# Patient Record
Sex: Male | Born: 1960 | Race: White | Hispanic: No | Marital: Single | Smoking: Current some day smoker
Health system: Southern US, Community
[De-identification: ages and names within clinical notes are randomized; demographics above are authoritative.]

## PROBLEM LIST (undated history)

## (undated) DIAGNOSIS — E785 Hyperlipidemia, unspecified: Secondary | ICD-10-CM

## (undated) DIAGNOSIS — I10 Essential (primary) hypertension: Secondary | ICD-10-CM

## (undated) DIAGNOSIS — M5136 Other intervertebral disc degeneration, lumbar region: Secondary | ICD-10-CM

## (undated) DIAGNOSIS — Z973 Presence of spectacles and contact lenses: Secondary | ICD-10-CM

## (undated) DIAGNOSIS — G43909 Migraine, unspecified, not intractable, without status migrainosus: Secondary | ICD-10-CM

## (undated) DIAGNOSIS — K219 Gastro-esophageal reflux disease without esophagitis: Secondary | ICD-10-CM

## (undated) DIAGNOSIS — M1990 Unspecified osteoarthritis, unspecified site: Secondary | ICD-10-CM

## (undated) DIAGNOSIS — M51369 Other intervertebral disc degeneration, lumbar region without mention of lumbar back pain or lower extremity pain: Secondary | ICD-10-CM

## (undated) DIAGNOSIS — Z8489 Family history of other specified conditions: Secondary | ICD-10-CM

## (undated) DIAGNOSIS — R739 Hyperglycemia, unspecified: Secondary | ICD-10-CM

## (undated) DIAGNOSIS — E559 Vitamin D deficiency, unspecified: Secondary | ICD-10-CM

## (undated) DIAGNOSIS — K635 Polyp of colon: Secondary | ICD-10-CM

## (undated) HISTORY — PX: BACK SURGERY: SHX140

---

## 2004-03-28 ENCOUNTER — Ambulatory Visit: Payer: Self-pay | Admitting: Gastroenterology

## 2009-09-26 ENCOUNTER — Ambulatory Visit: Payer: Self-pay | Admitting: Gastroenterology

## 2013-08-04 ENCOUNTER — Emergency Department: Payer: Self-pay | Admitting: Emergency Medicine

## 2015-08-03 ENCOUNTER — Encounter: Payer: Self-pay | Admitting: *Deleted

## 2015-08-04 ENCOUNTER — Encounter
Admission: RE | Disposition: A | Discharge status: Home / Self Care | Payer: Self-pay | Source: Ambulatory Visit | Attending: Gastroenterology

## 2015-08-04 ENCOUNTER — Encounter: Payer: Self-pay | Admitting: *Deleted

## 2015-08-04 ENCOUNTER — Ambulatory Visit: Payer: Managed Care, Other (non HMO) | Admitting: Anesthesiology

## 2015-08-04 ENCOUNTER — Ambulatory Visit
Admission: RE | Admit: 2015-08-04 | Discharge: 2015-08-04 | Disposition: A | Discharge status: Home / Self Care | Payer: Managed Care, Other (non HMO) | Source: Ambulatory Visit | Attending: Gastroenterology | Admitting: Gastroenterology

## 2015-08-04 DIAGNOSIS — M1990 Unspecified osteoarthritis, unspecified site: Secondary | ICD-10-CM | POA: Diagnosis not present

## 2015-08-04 DIAGNOSIS — G43909 Migraine, unspecified, not intractable, without status migrainosus: Secondary | ICD-10-CM | POA: Diagnosis not present

## 2015-08-04 DIAGNOSIS — K219 Gastro-esophageal reflux disease without esophagitis: Secondary | ICD-10-CM | POA: Insufficient documentation

## 2015-08-04 DIAGNOSIS — E785 Hyperlipidemia, unspecified: Secondary | ICD-10-CM | POA: Diagnosis not present

## 2015-08-04 DIAGNOSIS — Z79899 Other long term (current) drug therapy: Secondary | ICD-10-CM | POA: Diagnosis not present

## 2015-08-04 DIAGNOSIS — Z8371 Family history of colonic polyps: Secondary | ICD-10-CM | POA: Insufficient documentation

## 2015-08-04 DIAGNOSIS — Z1211 Encounter for screening for malignant neoplasm of colon: Secondary | ICD-10-CM | POA: Insufficient documentation

## 2015-08-04 DIAGNOSIS — D124 Benign neoplasm of descending colon: Secondary | ICD-10-CM | POA: Insufficient documentation

## 2015-08-04 DIAGNOSIS — I10 Essential (primary) hypertension: Secondary | ICD-10-CM | POA: Diagnosis not present

## 2015-08-04 DIAGNOSIS — E559 Vitamin D deficiency, unspecified: Secondary | ICD-10-CM | POA: Insufficient documentation

## 2015-08-04 DIAGNOSIS — M5136 Other intervertebral disc degeneration, lumbar region: Secondary | ICD-10-CM | POA: Insufficient documentation

## 2015-08-04 DIAGNOSIS — N529 Male erectile dysfunction, unspecified: Secondary | ICD-10-CM | POA: Diagnosis not present

## 2015-08-04 HISTORY — PX: COLONOSCOPY WITH PROPOFOL: SHX5780

## 2015-08-04 HISTORY — DX: Unspecified osteoarthritis, unspecified site: M19.90

## 2015-08-04 HISTORY — DX: Essential (primary) hypertension: I10

## 2015-08-04 HISTORY — DX: Vitamin D deficiency, unspecified: E55.9

## 2015-08-04 HISTORY — DX: Polyp of colon: K63.5

## 2015-08-04 HISTORY — DX: Other intervertebral disc degeneration, lumbar region without mention of lumbar back pain or lower extremity pain: M51.369

## 2015-08-04 HISTORY — DX: Hyperlipidemia, unspecified: E78.5

## 2015-08-04 HISTORY — DX: Gastro-esophageal reflux disease without esophagitis: K21.9

## 2015-08-04 HISTORY — DX: Hyperglycemia, unspecified: R73.9

## 2015-08-04 HISTORY — DX: Other intervertebral disc degeneration, lumbar region: M51.36

## 2015-08-04 HISTORY — DX: Migraine, unspecified, not intractable, without status migrainosus: G43.909

## 2015-08-04 SURGERY — COLONOSCOPY WITH PROPOFOL
Anesthesia: General

## 2015-08-04 MED ORDER — PROPOFOL 500 MG/50ML IV EMUL
INTRAVENOUS | Status: DC | PRN
  Administered 2015-08-04: 125 ug/kg/min via INTRAVENOUS

## 2015-08-04 MED ORDER — PROPOFOL 10 MG/ML IV BOLUS
INTRAVENOUS | Status: DC | PRN
  Administered 2015-08-04: 100 mg via INTRAVENOUS

## 2015-08-04 MED ORDER — LACTATED RINGERS IV SOLN
INTRAVENOUS | Status: DC | PRN
  Administered 2015-08-04: 11:00:00 via INTRAVENOUS

## 2015-08-04 MED ORDER — SODIUM CHLORIDE 0.9 % IV SOLN
INTRAVENOUS | Status: DC
  Administered 2015-08-04: 10:00:00 via INTRAVENOUS

## 2015-08-04 MED ORDER — SODIUM CHLORIDE 0.9 % IV SOLN: INTRAVENOUS | Status: DC

## 2015-08-04 NOTE — Transfer of Care (Signed)
Immediate Anesthesia Transfer of Care Note  Patient: Marco Richards  Procedure(s) Performed: Procedure(s): COLONOSCOPY WITH PROPOFOL (N/A)  Patient Location: PACU and Endoscopy Unit  Anesthesia Type:General  Level of Consciousness: awake, alert  and oriented  Airway & Oxygen Therapy: Patient Spontanous Breathing  Post-op Assessment: Report given to RN and Post -op Vital signs reviewed and stable  Post vital signs: Reviewed and stable  Last Vitals:  Filed Vitals:   08/04/15 1002  BP: 174/103  Pulse: 107  Temp: 36.7 C  Resp: 18    Complications: No apparent anesthesia complications

## 2015-08-04 NOTE — H&P (Signed)
    Primary Care Physician:  Adin Hector, MD Primary Gastroenterologist:  Dr. Candace Cruise  Pre-Procedure History & Physical: HPI:  Marco Richards is a 55 y.o. male is here for an colonoscopy.   Past Medical History  Diagnosis Date  . Arthritis   . Degenerative disc disease, lumbar   . GERD (gastroesophageal reflux disease)   . Colon polyps   . Hyperglycemia   . Hyperlipidemia   . Hypertension   . Migraine   . Vitamin D deficiency     Past Surgical History  Procedure Laterality Date  . Colonoscopy    . Back surgery      Prior to Admission medications   Medication Sig Start Date End Date Taking? Authorizing Provider  amLODipine (NORVASC) 5 MG tablet Take 5 mg by mouth daily.   Yes Historical Provider, MD  cholecalciferol (VITAMIN D) 400 units TABS tablet Take 1,000 Units by mouth.   Yes Historical Provider, MD  hydrochlorothiazide (MICROZIDE) 12.5 MG capsule Take 12.5 mg by mouth daily.   Yes Historical Provider, MD  omeprazole (PRILOSEC) 40 MG capsule Take 40 mg by mouth daily.   Yes Historical Provider, MD  pravastatin (PRAVACHOL) 80 MG tablet Take 80 mg by mouth daily.   Yes Historical Provider, MD  tadalafil (CIALIS) 20 MG tablet Take 20 mg by mouth daily as needed for erectile dysfunction.   Yes Historical Provider, MD    Allergies as of 07/26/2015  . (Not on File)    History reviewed. No pertinent family history.  Social History   Social History  . Marital Status: Married    Spouse Name: N/A  . Number of Children: N/A  . Years of Education: N/A   Occupational History  . Not on file.   Social History Main Topics  . Smoking status: Never Smoker   . Smokeless tobacco: Not on file  . Alcohol Use: No  . Drug Use: No  . Sexual Activity: Not on file   Other Topics Concern  . Not on file   Social History Narrative    Review of Systems: See HPI, otherwise negative ROS  Physical Exam: BP 174/103 mmHg  Pulse 107  Temp(Src) 98 F (36.7 C) (Tympanic)   Resp 18  Ht 5\' 9"  (1.753 m)  Wt 92.987 kg (205 lb)  BMI 30.26 kg/m2  SpO2 98% General:   Alert,  pleasant and cooperative in NAD Head:  Normocephalic and atraumatic. Neck:  Supple; no masses or thyromegaly. Lungs:  Clear throughout to auscultation.    Heart:  Regular rate and rhythm. Abdomen:  Soft, nontender and nondistended. Normal bowel sounds, without guarding, and without rebound.   Neurologic:  Alert and  oriented x4;  grossly normal neurologically.  Impression/Plan: Marco Richards is here for an colonoscopy to be performed for screening, family hx of colon polyps  Risks, benefits, limitations, and alternatives regarding colonoscopy have been reviewed with the patient.  Questions have been answered.  All parties agreeable.   Anarie Kalish, Lupita Dawn, MD  08/04/2015, 10:33 AM

## 2015-08-04 NOTE — Op Note (Signed)
[facility] Gastroenterology Patient Name: Marco Richards Procedure Date: 08/04/2015 11:17 AM MRN: [id number] Account #: [id number] Date of Birth: May 20, 1961 Admit Type: Outpatient Age: 55 Room: [facility] ENDO ROOM 4 Gender: Male Note Status: Finalized Procedure:         Colonoscopy Indications:       Colon cancer screening in patient at increased risk:                     Family history of 1st-degree relative with colon polyps Providers:         Lupita Dawn. Candace Cruise, MD Referring MD:      Ramonita Lab, MD (Referring MD) Medicines:         Monitored Anesthesia Care Complications:     No immediate complications. Procedure:         Pre-Anesthesia Assessment:                    - Prior to the procedure, a History and Physical was                     performed, and patient medications, allergies and                     sensitivities were reviewed. The patient's tolerance of                     previous anesthesia was reviewed.                    - The risks and benefits of the procedure and the sedation                     options and risks were discussed with the patient. All                     questions were answered and informed consent was obtained.                    - After reviewing the risks and benefits, the patient was                     deemed in satisfactory condition to undergo the procedure.                    After obtaining informed consent, the colonoscope was                     passed under direct vision. Throughout the procedure, the                     patient's blood pressure, pulse, and oxygen saturations                     were monitored continuously. The Colonoscope was                     introduced through the anus and advanced to the the cecum,                     identified by appendiceal orifice and ileocecal valve. The                     colonoscopy was performed without difficulty. The patient  tolerated the procedure well. The  quality of the bowel                     preparation was good. Findings:      A small polyp was found in the descending colon. The polyp was sessile.       The polyp was removed with a cold snare. Resection and retrieval were       complete.      The exam was otherwise without abnormality. Impression:        - One small polyp in the descending colon. Resected and                     retrieved.                    - The examination was otherwise normal. Recommendation:    - Discharge patient to home.                    - Await pathology results.                    - Repeat colonoscopy in 5 years for surveillance based on                     pathology results.                    - The findings and recommendations were discussed with the                     patient. Procedure Code(s): --- Professional ---                    [phone], Colonoscopy, flexible; with removal of tumor(s),                     polyp(s), or other lesion(s) by snare technique Diagnosis Code(s): --- Professional ---                    Z83.71, Family history of colonic polyps                    D12.4, Benign neoplasm of descending colon CPT copyright 2014 [employer]. All rights reserved. The codes documented in this report are preliminary and upon coder review may  be revised to meet current compliance requirements. Hulen Luster, MD 08/04/2015 11:36:31 AM This report has been signed electronically. Number of Addenda: 0 Note Initiated On: 08/04/2015 11:17 AM Scope Withdrawal Time: 0 hours 8 minutes 41 seconds  Total Procedure Duration: 0 hours 11 minutes 57 seconds       [facility]

## 2015-08-04 NOTE — Anesthesia Postprocedure Evaluation (Signed)
Anesthesia Post Note  Patient: Marco Richards  Procedure(s) Performed: Procedure(s) (LRB): COLONOSCOPY WITH PROPOFOL (N/A)  Patient location during evaluation: Endoscopy Anesthesia Type: General Level of consciousness: awake and alert Pain management: pain level controlled Vital Signs Assessment: post-procedure vital signs reviewed and stable Respiratory status: spontaneous breathing, nonlabored ventilation, respiratory function stable and patient connected to nasal cannula oxygen Cardiovascular status: blood pressure returned to baseline and stable Postop Assessment: no signs of nausea or vomiting Anesthetic complications: no    Last Vitals:  Filed Vitals:   08/04/15 1200 08/04/15 1210  BP: 161/95 165/94  Pulse: 86 80  Temp:    Resp: 15 16    Last Pain:  Filed Vitals:   08/04/15 1217  PainSc: 0-No pain                 Precious Haws Briane Birden

## 2015-08-04 NOTE — Anesthesia Preprocedure Evaluation (Addendum)
Anesthesia Evaluation  Patient identified by MRN, date of birth, ID band Patient awake    Reviewed: Allergy & Precautions, H&P , NPO status , Patient's Chart, lab work & pertinent test results  History of Anesthesia Complications Negative for: history of anesthetic complications  Airway Mallampati: III  TM Distance: >3 FB Neck ROM: full    Dental  (+) Poor Dentition, Chipped   Pulmonary neg pulmonary ROS, neg shortness of breath,    Pulmonary exam normal breath sounds clear to auscultation       Cardiovascular Exercise Tolerance: Good hypertension, (-) angina(-) DOE Normal cardiovascular exam Rhythm:regular Rate:Normal     Neuro/Psych  Headaches, negative psych ROS   GI/Hepatic Neg liver ROS, GERD  Controlled,  Endo/Other  negative endocrine ROS  Renal/GU negative Renal ROS  negative genitourinary   Musculoskeletal  (+) Arthritis ,   Abdominal   Peds  Hematology negative hematology ROS (+)   Anesthesia Other Findings Past Medical History:   Arthritis                                                    Degenerative disc disease, lumbar                            GERD (gastroesophageal reflux disease)                       Colon polyps                                                 Hyperglycemia                                                Hyperlipidemia                                               Hypertension                                                 Migraine                                                     Vitamin D deficiency                                        Past Surgical History:   COLONOSCOPY  BACK SURGERY                                                 BMI    Body Mass Index   30.25 kg/m 2      Reproductive/Obstetrics negative OB ROS                             Anesthesia Physical Anesthesia Plan  ASA:  III  Anesthesia Plan: General   Post-op Pain Management:    Induction:   Airway Management Planned:   Additional Equipment:   Intra-op Plan:   Post-operative Plan:   Informed Consent: I have reviewed the patients History and Physical, chart, labs and discussed the procedure including the risks, benefits and alternatives for the proposed anesthesia with the patient or authorized representative who has indicated his/her understanding and acceptance.   Dental Advisory Given  Plan Discussed with: Anesthesiologist, CRNA and Surgeon  Anesthesia Plan Comments:         Anesthesia Quick Evaluation

## 2015-08-05 LAB — SURGICAL PATHOLOGY

## 2016-04-13 ENCOUNTER — Other Ambulatory Visit: Payer: Self-pay | Admitting: Student

## 2016-04-13 DIAGNOSIS — M25561 Pain in right knee: Secondary | ICD-10-CM

## 2016-04-27 ENCOUNTER — Emergency Department: Payer: Managed Care, Other (non HMO)

## 2016-04-27 ENCOUNTER — Emergency Department
Admission: EM | Admit: 2016-04-27 | Discharge: 2016-04-27 | Disposition: A | Discharge status: Home / Self Care | Payer: Managed Care, Other (non HMO) | Attending: Emergency Medicine | Admitting: Emergency Medicine

## 2016-04-27 ENCOUNTER — Encounter: Payer: Self-pay | Admitting: Emergency Medicine

## 2016-04-27 DIAGNOSIS — R918 Other nonspecific abnormal finding of lung field: Secondary | ICD-10-CM | POA: Diagnosis not present

## 2016-04-27 DIAGNOSIS — I10 Essential (primary) hypertension: Secondary | ICD-10-CM

## 2016-04-27 DIAGNOSIS — R531 Weakness: Secondary | ICD-10-CM | POA: Insufficient documentation

## 2016-04-27 DIAGNOSIS — R109 Unspecified abdominal pain: Secondary | ICD-10-CM | POA: Diagnosis not present

## 2016-04-27 DIAGNOSIS — R55 Syncope and collapse: Secondary | ICD-10-CM

## 2016-04-27 LAB — COMPREHENSIVE METABOLIC PANEL
ALBUMIN: 4.2 g/dL (ref 3.5–5.0)
ALT: 26 U/L (ref 17–63)
AST: 23 U/L (ref 15–41)
Alkaline Phosphatase: 81 U/L (ref 38–126)
Anion gap: 9 (ref 5–15)
BUN: 13 mg/dL (ref 6–20)
CHLORIDE: 100 mmol/L — AB (ref 101–111)
CO2: 27 mmol/L (ref 22–32)
CREATININE: 0.96 mg/dL (ref 0.61–1.24)
Calcium: 9.1 mg/dL (ref 8.9–10.3)
GFR calc Af Amer: >60 mL/min (ref >60)
GLUCOSE: 99 mg/dL (ref 65–99)
Potassium: 3.5 mmol/L (ref 3.5–5.1)
SODIUM: 136 mmol/L (ref 135–145)
Total Bilirubin: 0.1 mg/dL — ABNORMAL LOW (ref 0.3–1.2)
Total Protein: 7.7 g/dL (ref 6.5–8.1)

## 2016-04-27 LAB — CBC WITH DIFFERENTIAL/PLATELET
BASOS ABS: 0.1 10*3/uL (ref 0–0.1)
Basophils Relative: 1 %
Eosinophils Absolute: 0.1 10*3/uL (ref 0–0.7)
Eosinophils Relative: 2 %
HEMATOCRIT: 50.1 % (ref 40.0–52.0)
HEMOGLOBIN: 16.9 g/dL (ref 13.0–18.0)
LYMPHS PCT: 22 %
Lymphs Abs: 1.8 10*3/uL (ref 1.0–3.6)
MCH: 28.8 pg (ref 26.0–34.0)
MCHC: 33.7 g/dL (ref 32.0–36.0)
MCV: 85.5 fL (ref 80.0–100.0)
MONO ABS: 0.7 10*3/uL (ref 0.2–1.0)
Monocytes Relative: 8 %
NEUTROS ABS: 5.7 10*3/uL (ref 1.4–6.5)
Neutrophils Relative %: 67 %
Platelets: 370 10*3/uL (ref 150–440)
RBC: 5.86 MIL/uL (ref 4.40–5.90)
RDW: 12.9 % (ref 11.5–14.5)
WBC: 8.5 10*3/uL (ref 3.8–10.6)

## 2016-04-27 LAB — TROPONIN I
Troponin I: <0.03 ng/mL (ref <0.03)
Troponin I: <0.03 ng/mL (ref <0.03)

## 2016-04-27 MED ORDER — SODIUM CHLORIDE 0.9 % IV BOLUS (SEPSIS)
1000.0000 mL | Freq: Once | INTRAVENOUS | Status: AC
  Administered 2016-04-27: 1000 mL via INTRAVENOUS

## 2016-04-27 MED ORDER — IOPAMIDOL (ISOVUE-370) INJECTION 76%
125.0000 mL | Freq: Once | INTRAVENOUS | Status: AC | PRN
  Administered 2016-04-27: 125 mL via INTRAVENOUS

## 2016-04-27 MED ORDER — ASPIRIN 81 MG PO CHEW: 81.0000 mg | CHEWABLE_TABLET | Freq: Every day | ORAL | 0 refills | Status: AC

## 2016-04-27 NOTE — ED Provider Notes (Signed)
[facility] Emergency Department Provider Note  ____________________________________________   First MD Initiated Contact with Patient 04/27/16 1700     (approximate)  I have reviewed the triage vital signs and the nursing notes.   HISTORY  Chief Complaint Near Syncope   HPI Marco Richards is a 55 y.o. male with a history of colon polyps as well as hypertension presenting to the emergency department today with a near-syncopal episode. He says that he was driving in his car and had several episodes of lightheadedness and diaphoresis over the course of a half an hour. He says he had 2-3 episodes which lasted several minutes each where he said he felt like he was going to pass out. He denies any chest pain or abdominal pain. Denies any nausea or vomiting. Says that he has ongoing intermittent diarrhea over the past 2 years or so. Says that he also has had intermittent blood in the stool with blood on the outside of his stools. Pedis past febrile was just a polyp but no diverticular disease. Denies any burning with urination. Says that he has had multiple episodes of syncope over the past 15 years and had stress tests which were negative. He says that he also has a family history of aortic aneurysm in multiple relatives.   Past Medical History:  Diagnosis Date  . Arthritis   . Colon polyps   . Degenerative disc disease, lumbar   . GERD (gastroesophageal reflux disease)   . Hyperglycemia   . Hyperlipidemia   . Hypertension   . Migraine   . Vitamin D deficiency     There are no active problems to display for this patient.   Past Surgical History:  Procedure Laterality Date  . BACK SURGERY    . COLONOSCOPY    . COLONOSCOPY WITH PROPOFOL N/A 08/04/2015   Procedure: COLONOSCOPY WITH PROPOFOL;  Surgeon: Hulen Luster, MD;  Location: [facility] ENDOSCOPY;  Service: Gastroenterology;  Laterality: N/A;    Prior to Admission medications   Medication Sig Start Date End  Date Taking? Authorizing Provider  amLODipine (NORVASC) 5 MG tablet Take 5 mg by mouth daily.    Historical Provider, MD  cholecalciferol (VITAMIN D) 400 units TABS tablet Take 1,000 Units by mouth.    Historical Provider, MD  hydrochlorothiazide (MICROZIDE) 12.5 MG capsule Take 12.5 mg by mouth daily.    Historical Provider, MD  omeprazole (PRILOSEC) 40 MG capsule Take 40 mg by mouth daily.    Historical Provider, MD  pravastatin (PRAVACHOL) 80 MG tablet Take 80 mg by mouth daily.    Historical Provider, MD  tadalafil (CIALIS) 20 MG tablet Take 20 mg by mouth daily as needed for erectile dysfunction.    Historical Provider, MD    Allergies Losartan  No family history on file.  Social History Social History  Substance Use Topics  . Smoking status: Never Smoker  . Smokeless tobacco: Never Used  . Alcohol use No    Review of Systems Constitutional: No fever/chills Eyes: No visual changes. ENT: No sore throat. Cardiovascular: Denies chest pain. Respiratory: Denies shortness of breath. Gastrointestinal: No abdominal pain.  No nausea, no vomiting.  No diarrhea.  No constipation. Genitourinary: Negative for dysuria. Musculoskeletal: Negative for back pain. Skin: Negative for rash. Neurological: Negative for focal weakness or numbness.  Complaining of several days of nasal congestion as well as frontal headache on the left side of his head which she says a 4 out of 10 at this time.  Says that he has had chronic headaches over years. Denies any sudden onset thunderclap quality to his recent headache.  10-point ROS otherwise negative.  ____________________________________________   PHYSICAL EXAM:  VITAL SIGNS: ED Triage Vitals  Enc Vitals Group     BP 04/27/16 1545 (!) 154/104     Pulse Rate 04/27/16 1545 98     Resp 04/27/16 1545 20     Temp 04/27/16 1545 98.4 F (36.9 C)     Temp Source 04/27/16 1545 Oral     SpO2 04/27/16 1545 97 %     Weight 04/27/16 1548 190 lb (86.2 kg)      Height 04/27/16 1548 5\' 10"  (1.778 m)     Head Circumference --      Peak Flow --      Pain Score 04/27/16 1551 0     Pain Loc --      Pain Edu? --      Excl. in [place]? --     Constitutional: Alert and oriented. Well appearing and in no acute distress. Eyes: Conjunctivae are normal. PERRL. EOMI. Head: Atraumatic. Nose: No congestion/rhinnorhea. Mouth/Throat: Mucous membranes are moist.  Neck: No stridor.   Cardiovascular: Normal rate, regular rhythm. Grossly normal heart sounds.  Good peripheral circulation With bilateral, equal and intact radial as well as dorsalis pedis pulses. Respiratory: Normal respiratory effort.  No retractions. Lungs CTAB. Gastrointestinal: Soft and nontender. No distention.  Musculoskeletal: No lower extremity tenderness nor edema.  No joint effusions. Neurologic:  Normal speech and language. No gross focal neurologic deficits are appreciated. No gait instability. Skin:  Skin is warm, dry and intact. No rash noted. Psychiatric: Mood and affect are normal. Speech and behavior are normal.  ____________________________________________   LABS (all labs ordered are listed, but only abnormal results are displayed)  Labs Reviewed  COMPREHENSIVE METABOLIC PANEL - Abnormal; Notable for the following:       Result Value   Chloride 100 (*)    Total Bilirubin 0.1 (*)    All other components within normal limits  CBC WITH DIFFERENTIAL/PLATELET  TROPONIN I  TROPONIN I   ____________________________________________  EKG  ED ECG REPORT I, Doran Stabler, the attending physician, personally viewed and interpreted this ECG.   Date: 04/27/2016  EKG Time: 1541  Rate: 98  Rhythm: normal sinus rhythm  Axis: Normal  Intervals:none  ST&T Change: No ST segment elevation or depression. No abnormal T-wave inversion.  ____________________________________________  RADIOLOGY    CT Angio Chest Aorta W and/or Wo Contrast (Final result)  Result time  04/27/16 18:07:17  Final result by Inez Catalina, MD (04/27/16 18:07:17)           Narrative:   CLINICAL DATA: Syncopal episode, family history of aortic aneurysm, hypertension, initial encounter  EXAM: CT ANGIOGRAPHY CHEST AND ABDOMEN  TECHNIQUE: Multidetector CT imaging of the chest and abdomen was performed using the standard protocol during bolus administration of intravenous contrast. Multiplanar CT image reconstructions and MIPs were obtained to evaluate the vascular anatomy.  CONTRAST: 125 mL Isovue 370.  COMPARISON: None.  FINDINGS: CTA CHEST FINDINGS  Cardiovascular: The thoracic aorta and its branches demonstrates some mild calcifications. No aneurysmal dilatation or evidence of dissection is identified. Pulmonary artery demonstrates no evidence of pulmonary emboli. Cardiac structures appear within normal limits.  Mediastinum/Nodes: The thoracic inlet is within normal limits. No hilar or mediastinal adenopathy is identified. The esophagus as visualized is within normal limits. No axillary adenopathy is seen.  Lungs/Pleura: Lungs are  clear. No pleural effusion or pneumothorax.  Musculoskeletal: No chest wall abnormality. No acute or significant osseous findings.  Review of the MIP images confirms the above findings.  CTA ABDOMEN FINDINGS  Aorta: Scattered atherosclerotic changes without aneurysmal dilatation or dissection.  Celiac: Mild calcified plaque is noted at the origin without focal stenosis.  SMA: Mild calcified plaque is noted at the origin without focal stenosis.  Renals: Single renal arteries bilaterally without focal stenosis.  IMA: Widely patent.  The iliacs: Widely patent  Veins: Not timed for optimum venous opacification although no gross abnormality is seen.  Hepatobiliary: Fatty liver is noted. The gallbladder is within normal limits.  Pancreas: Unremarkable.  Spleen: Within normal limits.  Adrenals/Urinary Tract: The  adrenal glands are unremarkable. The kidneys show no renal calculi or obstructive changes. The ureters are within normal limits bilaterally. The bladder is well distended.  Stomach/Bowel: The appendix is within normal limits. No obstructive changes are seen. Under diverticular change is noted.  Lymphatic: No significant lymphadenopathy is seen.  Musculoskeletal: Bony structures are within normal limits for the patient's age. Mild degenerative change of the lumbar spine is seen.  Review of the MIP images confirms the above findings.  IMPRESSION: No evidence of acute aortic abnormality.  No pulmonary emboli are seen.  Chronic changes as described above without acute abnormality.   Electronically Signed By: Inez Catalina M.D. On: 04/27/2016 18:07            CT ANGIO ABDOMEN W &/OR WO CONTRAST (Final result)  Result time 04/27/16 18:07:17  Final result by Inez Catalina, MD (04/27/16 18:07:17)           Narrative:   CLINICAL DATA: Syncopal episode, family history of aortic aneurysm, hypertension, initial encounter  EXAM: CT ANGIOGRAPHY CHEST AND ABDOMEN  TECHNIQUE: Multidetector CT imaging of the chest and abdomen was performed using the standard protocol during bolus administration of intravenous contrast. Multiplanar CT image reconstructions and MIPs were obtained to evaluate the vascular anatomy.  CONTRAST: 125 mL Isovue 370.  COMPARISON: None.  FINDINGS: CTA CHEST FINDINGS  Cardiovascular: The thoracic aorta and its branches demonstrates some mild calcifications. No aneurysmal dilatation or evidence of dissection is identified. Pulmonary artery demonstrates no evidence of pulmonary emboli. Cardiac structures appear within normal limits.  Mediastinum/Nodes: The thoracic inlet is within normal limits. No hilar or mediastinal adenopathy is identified. The esophagus as visualized is within normal limits. No axillary adenopathy is seen.  Lungs/Pleura:  Lungs are clear. No pleural effusion or pneumothorax.  Musculoskeletal: No chest wall abnormality. No acute or significant osseous findings.  Review of the MIP images confirms the above findings.  CTA ABDOMEN FINDINGS  Aorta: Scattered atherosclerotic changes without aneurysmal dilatation or dissection.  Celiac: Mild calcified plaque is noted at the origin without focal stenosis.  SMA: Mild calcified plaque is noted at the origin without focal stenosis.  Renals: Single renal arteries bilaterally without focal stenosis.  IMA: Widely patent.  The iliacs: Widely patent  Veins: Not timed for optimum venous opacification although no gross abnormality is seen.  Hepatobiliary: Fatty liver is noted. The gallbladder is within normal limits.  Pancreas: Unremarkable.  Spleen: Within normal limits.  Adrenals/Urinary Tract: The adrenal glands are unremarkable. The kidneys show no renal calculi or obstructive changes. The ureters are within normal limits bilaterally. The bladder is well distended.  Stomach/Bowel: The appendix is within normal limits. No obstructive changes are seen. Under diverticular change is noted.  Lymphatic: No significant lymphadenopathy is seen.  Musculoskeletal:  Bony structures are within normal limits for the patient's age. Mild degenerative change of the lumbar spine is seen.  Review of the MIP images confirms the above findings.  IMPRESSION: No evidence of acute aortic abnormality.  No pulmonary emboli are seen.  Chronic changes as described above without acute abnormality.   Electronically Signed By: Inez Catalina M.D. On: 04/27/2016 18:07            CT Head Wo Contrast (Final result)  Result time 04/27/16 18:07:17  Final result by Jeannine Boga, MD (04/27/16 18:07:17)           Narrative:   CLINICAL DATA: Initial evaluation for acute syncope.  EXAM: CT HEAD WITHOUT CONTRAST  TECHNIQUE: Contiguous axial images  were obtained from the base of the skull through the vertex without intravenous contrast.  COMPARISON: None available.  FINDINGS: Brain: Cerebral volume within normal limits. No acute intracranial hemorrhage. No evidence for acute large vessel territory infarct. No mass lesion, midline shift, or mass effect. No hydrocephalus. No extra-axial fluid collection.  Vascular: No hyperdense vessel.  Skull: Scalp soft tissues demonstrate no acute abnormality. Small 1 cm soft tissue lesion at the left frontal scalp noted, possibly a small sebaceous cyst. Calvarium intact.  Sinuses/Orbits: Globes and orbits within normal limits. Paranasal sinuses are clear. No mastoid effusion.  IMPRESSION: Negative head CT. No acute intracranial process identified.   Electronically Signed By: Jeannine Boga M.D. On: 04/27/2016 18:07            ____________________________________________   PROCEDURES  Procedure(s) performed:   Procedures  Critical Care performed:   ____________________________________________   INITIAL IMPRESSION / ASSESSMENT AND PLAN / ED COURSE  Pertinent labs & imaging results that were available during my care of the patient were reviewed by me and considered in my medical decision making (see chart for details).  ----------------------------------------- 8:11 PM on 04/27/2016 -----------------------------------------  Patient resting comfortably at this time. No complaints. Very reassuring lab as well as imaging workup. We discussed the mild and scattered calcifications. We also discussed increasing the patient's hydrochlorothiazide 25 mg per day and following up with his primary care doctor. He already has an appointment arranged with his primary care doctor for next Thursday. I'll also be giving him the follow-up information for cardiology and we discussed calling Monday morning for an appointment to be scheduled for Monday afternoon or Tuesday. He is  understanding of this plan and willing to comply. Possible ongoing episodes of syncope/near syncope. He has had in the past but was concerned now for advancing age with hypertension. I do not see a reason to keep him in the emergency department now for further workup. No chest pain or other anginal equivalents. He knows that he must follow-up in the office for further workup. He is understanding of this plan and willing to comply.   Clinical Course     ____________________________________________   FINAL CLINICAL IMPRESSION(S) / ED DIAGNOSES  Near-syncope. Hypertension. Weakness.    NEW MEDICATIONS STARTED DURING THIS VISIT:  New Prescriptions   No medications on file     Note:  This document was prepared using Dragon voice recognition software and may include unintentional dictation errors.    Orbie Pyo, MD 04/27/16 2013

## 2016-04-27 NOTE — ED Notes (Signed)
Pt. Going home with family. 

## 2016-04-27 NOTE — ED Notes (Signed)
Pt states "i just feel dehydrated"

## 2016-04-27 NOTE — ED Triage Notes (Signed)
Reports near syncopal episode today.  On arrival pt is clammy, a&o, ambulates well.

## 2016-05-04 ENCOUNTER — Ambulatory Visit
Admission: RE | Admit: 2016-05-04 | Discharge: 2016-05-04 | Disposition: A | Discharge status: Home / Self Care | Payer: Managed Care, Other (non HMO) | Source: Ambulatory Visit | Attending: Student | Admitting: Student

## 2016-05-04 DIAGNOSIS — S83241A Other tear of medial meniscus, current injury, right knee, initial encounter: Secondary | ICD-10-CM | POA: Diagnosis not present

## 2016-05-04 DIAGNOSIS — M25561 Pain in right knee: Secondary | ICD-10-CM | POA: Diagnosis not present

## 2016-05-04 DIAGNOSIS — X58XXXA Exposure to other specified factors, initial encounter: Secondary | ICD-10-CM | POA: Diagnosis not present

## 2016-06-13 ENCOUNTER — Encounter: Payer: Self-pay | Admitting: *Deleted

## 2016-06-20 ENCOUNTER — Encounter
Admission: RE | Disposition: A | Discharge status: Home / Self Care | Payer: Self-pay | Source: Ambulatory Visit | Attending: Surgery

## 2016-06-20 ENCOUNTER — Ambulatory Visit: Payer: Managed Care, Other (non HMO) | Admitting: Anesthesiology

## 2016-06-20 ENCOUNTER — Ambulatory Visit
Admission: RE | Admit: 2016-06-20 | Discharge: 2016-06-20 | Disposition: A | Discharge status: Home / Self Care | Payer: Managed Care, Other (non HMO) | Source: Ambulatory Visit | Attending: Surgery | Admitting: Surgery

## 2016-06-20 DIAGNOSIS — S83241A Other tear of medial meniscus, current injury, right knee, initial encounter: Secondary | ICD-10-CM | POA: Insufficient documentation

## 2016-06-20 DIAGNOSIS — G43909 Migraine, unspecified, not intractable, without status migrainosus: Secondary | ICD-10-CM | POA: Insufficient documentation

## 2016-06-20 DIAGNOSIS — Z86010 Personal history of colonic polyps: Secondary | ICD-10-CM | POA: Insufficient documentation

## 2016-06-20 DIAGNOSIS — Z79899 Other long term (current) drug therapy: Secondary | ICD-10-CM | POA: Insufficient documentation

## 2016-06-20 DIAGNOSIS — Z8249 Family history of ischemic heart disease and other diseases of the circulatory system: Secondary | ICD-10-CM | POA: Insufficient documentation

## 2016-06-20 DIAGNOSIS — R739 Hyperglycemia, unspecified: Secondary | ICD-10-CM | POA: Insufficient documentation

## 2016-06-20 DIAGNOSIS — Z825 Family history of asthma and other chronic lower respiratory diseases: Secondary | ICD-10-CM | POA: Insufficient documentation

## 2016-06-20 DIAGNOSIS — X58XXXA Exposure to other specified factors, initial encounter: Secondary | ICD-10-CM | POA: Insufficient documentation

## 2016-06-20 DIAGNOSIS — K219 Gastro-esophageal reflux disease without esophagitis: Secondary | ICD-10-CM | POA: Diagnosis not present

## 2016-06-20 DIAGNOSIS — M1711 Unilateral primary osteoarthritis, right knee: Secondary | ICD-10-CM | POA: Insufficient documentation

## 2016-06-20 DIAGNOSIS — M6751 Plica syndrome, right knee: Secondary | ICD-10-CM | POA: Insufficient documentation

## 2016-06-20 DIAGNOSIS — E559 Vitamin D deficiency, unspecified: Secondary | ICD-10-CM | POA: Diagnosis not present

## 2016-06-20 DIAGNOSIS — E785 Hyperlipidemia, unspecified: Secondary | ICD-10-CM | POA: Insufficient documentation

## 2016-06-20 DIAGNOSIS — Z888 Allergy status to other drugs, medicaments and biological substances status: Secondary | ICD-10-CM | POA: Insufficient documentation

## 2016-06-20 DIAGNOSIS — Z7982 Long term (current) use of aspirin: Secondary | ICD-10-CM | POA: Diagnosis not present

## 2016-06-20 DIAGNOSIS — I10 Essential (primary) hypertension: Secondary | ICD-10-CM | POA: Insufficient documentation

## 2016-06-20 HISTORY — PX: KNEE ARTHROSCOPY: SHX127

## 2016-06-20 HISTORY — DX: Family history of other specified conditions: Z84.89

## 2016-06-20 HISTORY — DX: Presence of spectacles and contact lenses: Z97.3

## 2016-06-20 SURGERY — ARTHROSCOPY, KNEE
Anesthesia: General | Site: Knee | Laterality: Right | Wound class: Clean

## 2016-06-20 MED ORDER — BUPIVACAINE-EPINEPHRINE (PF) 0.5% -1:200000 IJ SOLN
INTRAMUSCULAR | Status: DC | PRN
  Administered 2016-06-20: 14:00:00 via INTRAMUSCULAR

## 2016-06-20 MED ORDER — LIDOCAINE HCL (CARDIAC) 20 MG/ML IV SOLN
INTRAVENOUS | Status: DC | PRN
  Administered 2016-06-20: 40 mg via INTRATRACHEAL

## 2016-06-20 MED ORDER — LACTATED RINGERS IV SOLN
INTRAVENOUS | Status: DC
  Administered 2016-06-20: 13:00:00 via INTRAVENOUS

## 2016-06-20 MED ORDER — OXYCODONE HCL 5 MG/5ML PO SOLN: 5.0000 mg | Freq: Once | ORAL | Status: DC | PRN

## 2016-06-20 MED ORDER — HYDROCODONE-ACETAMINOPHEN 5-325 MG PO TABS: 1.0000 | ORAL_TABLET | Freq: Four times a day (QID) | ORAL | 0 refills | Status: AC | PRN

## 2016-06-20 MED ORDER — BUPIVACAINE-EPINEPHRINE 0.5% -1:200000 IJ SOLN
INTRAMUSCULAR | Status: DC | PRN
  Administered 2016-06-20: 20 mL

## 2016-06-20 MED ORDER — DEXAMETHASONE SODIUM PHOSPHATE 4 MG/ML IJ SOLN
INTRAMUSCULAR | Status: DC | PRN
  Administered 2016-06-20: 4 mg via INTRAVENOUS

## 2016-06-20 MED ORDER — CEFAZOLIN SODIUM-DEXTROSE 2-4 GM/100ML-% IV SOLN
2.0000 g | Freq: Once | INTRAVENOUS | Status: AC
  Administered 2016-06-20: 2 g via INTRAVENOUS

## 2016-06-20 MED ORDER — MIDAZOLAM HCL 5 MG/5ML IJ SOLN
INTRAMUSCULAR | Status: DC | PRN
  Administered 2016-06-20: 2 mg via INTRAVENOUS

## 2016-06-20 MED ORDER — PROPOFOL 10 MG/ML IV BOLUS
INTRAVENOUS | Status: DC | PRN
  Administered 2016-06-20: 150 mg via INTRAVENOUS

## 2016-06-20 MED ORDER — OXYCODONE HCL 5 MG PO TABS: 5.0000 mg | ORAL_TABLET | Freq: Once | ORAL | Status: DC | PRN

## 2016-06-20 MED ORDER — POTASSIUM CHLORIDE IN NACL 20-0.9 MEQ/L-% IV SOLN: INTRAVENOUS | Status: DC

## 2016-06-20 MED ORDER — GLYCOPYRROLATE 0.2 MG/ML IJ SOLN
INTRAMUSCULAR | Status: DC | PRN
  Administered 2016-06-20: 0.1 mg via INTRAVENOUS

## 2016-06-20 MED ORDER — ONDANSETRON HCL 4 MG/2ML IJ SOLN
INTRAMUSCULAR | Status: DC | PRN
  Administered 2016-06-20: 4 mg via INTRAVENOUS

## 2016-06-20 MED ORDER — ONDANSETRON HCL 4 MG/2ML IJ SOLN: 4.0000 mg | Freq: Once | INTRAMUSCULAR | Status: DC | PRN

## 2016-06-20 MED ORDER — FENTANYL CITRATE (PF) 100 MCG/2ML IJ SOLN
INTRAMUSCULAR | Status: DC | PRN
  Administered 2016-06-20: 100 ug via INTRAVENOUS

## 2016-06-20 MED ORDER — FENTANYL CITRATE (PF) 100 MCG/2ML IJ SOLN: 25.0000 ug | INTRAMUSCULAR | Status: DC | PRN

## 2016-06-20 SURGICAL SUPPLY — 30 items
BANDAGE ELASTIC 6 LF NS (GAUZE/BANDAGES/DRESSINGS) ×3 IMPLANT
BLADE FULL RADIUS 3.5 (BLADE) ×3 IMPLANT
BUR ACROMIONIZER 4.0 (BURR) IMPLANT
CHLORAPREP W/TINT 26ML (MISCELLANEOUS) ×3 IMPLANT
COVER LIGHT HANDLE UNIVERSAL (MISCELLANEOUS) ×6 IMPLANT
CUFF TOURN SGL QUICK 30 (MISCELLANEOUS)
CUFF TOURN SGL QUICK 34 (TOURNIQUET CUFF)
CUFF TRNQT CYL 34X4X40X1 (TOURNIQUET CUFF) IMPLANT
CUFF TRNQT CYL LO 30X4X (MISCELLANEOUS) IMPLANT
DRAPE IMP U-DRAPE 54X76 (DRAPES) ×3 IMPLANT
GAUZE SPONGE 4X4 12PLY STRL (GAUZE/BANDAGES/DRESSINGS) ×3 IMPLANT
GLOVE BIO SURGEON STRL SZ8 (GLOVE) ×6 IMPLANT
GLOVE INDICATOR 8.0 STRL GRN (GLOVE) ×3 IMPLANT
GOWN STRL REUS W/ TWL LRG LVL3 (GOWN DISPOSABLE) ×1 IMPLANT
GOWN STRL REUS W/ TWL XL LVL3 (GOWN DISPOSABLE) ×1 IMPLANT
GOWN STRL REUS W/TWL LRG LVL3 (GOWN DISPOSABLE) ×2
GOWN STRL REUS W/TWL XL LVL3 (GOWN DISPOSABLE) ×2
IV LACTATED RINGER IRRG 3000ML (IV SOLUTION) ×4
IV LR IRRIG 3000ML ARTHROMATIC (IV SOLUTION) ×2 IMPLANT
KIT ROOM TURNOVER OR (KITS) ×3 IMPLANT
MANIFOLD 4PT FOR NEPTUNE1 (MISCELLANEOUS) ×3 IMPLANT
NEEDLE HYPO 21X1.5 SAFETY (NEEDLE) ×6 IMPLANT
PACK ARTHROSCOPY KNEE (MISCELLANEOUS) ×3 IMPLANT
STRAP BODY AND KNEE 60X3 (MISCELLANEOUS) ×3 IMPLANT
SUT PROLENE 4 0 PS 2 18 (SUTURE) ×3 IMPLANT
SUT VIC AB 2-0 CT1 27 (SUTURE)
SUT VIC AB 2-0 CT1 TAPERPNT 27 (SUTURE) IMPLANT
SYR 50ML LL SCALE MARK (SYRINGE) ×3 IMPLANT
TUBING ARTHRO INFLOW-ONLY STRL (TUBING) ×3 IMPLANT
WAND HAND CNTRL MULTIVAC 90 (MISCELLANEOUS) IMPLANT

## 2016-06-20 NOTE — Anesthesia Procedure Notes (Signed)
Procedure Name: LMA Insertion Date/Time: 06/20/2016 1:32 PM Performed by: Mayme Genta Pre-anesthesia Checklist: Patient identified, Emergency Drugs available, Suction available, Timeout performed and Patient being monitored Patient Re-evaluated:Patient Re-evaluated prior to inductionOxygen Delivery Method: Circle system utilized Preoxygenation: Pre-oxygenation with 100% oxygen Intubation Type: IV induction LMA: LMA inserted LMA Size: 4.0 Number of attempts: 1 Placement Confirmation: positive ETCO2 and breath sounds checked- equal and bilateral Tube secured with: Tape

## 2016-06-20 NOTE — Transfer of Care (Signed)
Immediate Anesthesia Transfer of Care Note  Patient: Marco Richards  Procedure(s) Performed: Procedure(s): ARTHROSCOPY KNEE WITH DEBRIDEMENT AND MEDIAL MENISCECTOMY (Right)  Patient Location: PACU  Anesthesia Type: General  Level of Consciousness: awake, alert  and patient cooperative  Airway and Oxygen Therapy: Patient Spontanous Breathing and Patient connected to supplemental oxygen  Post-op Assessment: Post-op Vital signs reviewed, Patient's Cardiovascular Status Stable, Respiratory Function Stable, Patent Airway and No signs of Nausea or vomiting  Post-op Vital Signs: Reviewed and stable  Complications: No apparent anesthesia complications

## 2016-06-20 NOTE — Discharge Instructions (Signed)
General Anesthesia, Adult, Care After These instructions provide you with information about caring for yourself after your procedure. Your health care provider may also give you more specific instructions. Your treatment has been planned according to current medical practices, but problems sometimes occur. Call your health care provider if you have any problems or questions after your procedure. What can I expect after the procedure? After the procedure, it is common to have:  Vomiting.  A sore throat.  Mental slowness. It is common to feel:  Nauseous.  Cold or shivery.  Sleepy.  Tired.  Sore or achy, even in parts of your body where you did not have surgery. Follow these instructions at home: For at least 24 hours after the procedure:  Do not:  Participate in activities where you could fall or become injured.  Drive.  Use heavy machinery.  Drink alcohol.  Take sleeping pills or medicines that cause drowsiness.  Make important decisions or sign legal documents.  Take care of children on your own.  Rest. Eating and drinking  If you vomit, drink water, juice, or soup when you can drink without vomiting.  Drink enough fluid to keep your urine clear or pale yellow.  Make sure you have little or no nausea before eating solid foods.  Follow the diet recommended by your health care provider. General instructions  Have a responsible adult stay with you until you are awake and alert.  Return to your normal activities as told by your health care provider. Ask your health care provider what activities are safe for you.  Take over-the-counter and prescription medicines only as told by your health care provider.  If you smoke, do not smoke without supervision.  Keep all follow-up visits as told by your health care provider. This is important. Contact a health care provider if:  You continue to have nausea or vomiting at home, and medicines are not helpful.  You  cannot drink fluids or start eating again.  You cannot urinate after 8-12 hours.  You develop a skin rash.  You have fever.  You have increasing redness at the site of your procedure. Get help right away if:  You have difficulty breathing.  You have chest pain.  You have unexpected bleeding.  You feel that you are having a life-threatening or urgent problem. This information is not intended to replace advice given to you by your health care provider. Make sure you discuss any questions you have with your health care provider. Document Released: 09/17/2000 Document Revised: 11/14/2015 Document Reviewed: 05/26/2015 Elsevier Interactive Patient Education  2017 [place] dressing dry and intact.  May shower after dressing changed on post-op day #4 (Sunday).  Cover sutures with Band-Aids after drying off. Apply ice frequently to knee. Take ibuprofen 800 mg TID with meals for 7-10 days, then as necessary. Take pain medication as prescribed or ES Tylenol when needed.  May weight-bear as tolerated - use crutches or walker as needed. Follow-up in 10-14 days or as scheduled.

## 2016-06-20 NOTE — Op Note (Signed)
06/20/2016  2:13 PM  Patient:   Marco Richards  Pre-Op Diagnosis:   Medial meniscus tear with mild underlying degenerative joint disease, right knee.  Postoperative diagnosis:   Medial meniscus tear with large suprapatellar plica and mild underlying degenerative joint disease, right knee.  Procedure:   Arthroscopic partial medial meniscectomy, arthroscopic debridement of suprapatellar plica, and debridement, right knee.  Surgeon:   Pascal Lux, M.D.  Anesthesia:   General LMA.  Findings:   As above. The medial meniscus demonstrated an unstable flap tear involving the posterior and posterior medial portions of the meniscus. The lateral meniscus was in satisfactory condition, as were the anterior and posterior cruciate ligaments. There were grade 2 chondromalacial changes involving the femoral trochlea and the medial femoral condyle. The remaining articular surfaces all were in satisfactory condition.  Complications:   None.  EBL:   2 cc.  Total fluids:   750 cc of crystalloid.  Tourniquet time:   None  Drains:   None  Closure:   4-0 Prolene interrupted sutures.  Brief clinical note:   The patient is a 55 year old male with a several month history of medial sided right knee pain. His symptoms have persisted despite medications, activity modification, etc. His history and examination are consistent with a medial meniscus tear, confirmed by MRI scan. The patient presents at this time for arthroscopy, debridement, and partial medial meniscectomy.  Procedure:   The patient was brought into the operating room and lain in the supine position. After adequate general laryngeal mask anesthesia was obtained, a timeout was performed to verify the appropriate side. The patient's right knee was injected sterilely using a solution of 30 cc of 1% lidocaine and 30 cc of 0.5% Sensorcaine with epinephrine. The right lower extremity was prepped with ChloraPrep solution before being draped  sterilely. Preoperative antibiotics were administered. The expected portal sites were injected with 0.5% Sensorcaine with epinephrine before the camera was placed in the anterolateral portal and instrumentation performed through the anteromedial portal. The knee was sequentially examined beginning in the suprapatellar pouch, then progressing to the patellofemoral space, the medial gutter compartment, the notch, and finally the lateral compartment and gutter. The findings were as described above. Abundant reactive synovial tissues anteriorly were debrided using the full-radius resector in order to improve visualization. This debridement exposed a large suprapatellar plica sweeping down from the superomedial aspect of the suprapatellar region across the joint into the anterior portion of the lateral compartment. This was debrided back to stable margins using the full-radius resector. The displaced flap tear of the medial meniscus was debrided using narrow up-biting baskets and full-radius resector and grasper before the remaining margins were smoothed with the full-radius resector. Laterally, the meniscus was intact to probing. The instruments were removed from the joint after suctioning the excess fluid. The portal sites were closed using 4-0 Prolene interrupted sutures before a sterile bulky dressing was applied to the knee. The patient was then awakened, extubated, and returned to the recovery room in satisfactory condition after tolerating the procedure well.

## 2016-06-20 NOTE — Anesthesia Postprocedure Evaluation (Signed)
Anesthesia Post Note  Patient: Marco Richards  Procedure(s) Performed: Procedure(s) (LRB): ARTHROSCOPY KNEE WITH DEBRIDEMENT AND MEDIAL MENISCECTOMY (Right)  Patient location during evaluation: PACU Anesthesia Type: General Level of consciousness: awake and alert Pain management: pain level controlled Vital Signs Assessment: post-procedure vital signs reviewed and stable Respiratory status: spontaneous breathing, nonlabored ventilation, respiratory function stable and patient connected to nasal cannula oxygen Cardiovascular status: blood pressure returned to baseline and stable Postop Assessment: no signs of nausea or vomiting Anesthetic complications: no    Amaryllis Dyke

## 2016-06-20 NOTE — H&P (Signed)
Paper H&P to be scanned into permanent record. H&P reviewed. No changes. 

## 2016-06-20 NOTE — Anesthesia Preprocedure Evaluation (Signed)
Anesthesia Evaluation  Patient identified by MRN, date of birth, ID band Patient awake    Reviewed: Allergy & Precautions, H&P , NPO status , Patient's Chart, lab work & pertinent test results  History of Anesthesia Complications Negative for: history of anesthetic complications  Airway Mallampati: III  TM Distance: >3 FB Neck ROM: full    Dental  (+) Poor Dentition, Chipped   Pulmonary neg pulmonary ROS, neg shortness of breath, Current Smoker,    Pulmonary exam normal breath sounds clear to auscultation       Cardiovascular Exercise Tolerance: Good hypertension, (-) angina(-) DOE Normal cardiovascular exam Rhythm:regular Rate:Normal     Neuro/Psych  Headaches, negative psych ROS   GI/Hepatic Neg liver ROS, GERD  Controlled,  Endo/Other  negative endocrine ROS  Renal/GU negative Renal ROS  negative genitourinary   Musculoskeletal  (+) Arthritis ,   Abdominal   Peds  Hematology negative hematology ROS (+)   Anesthesia Other Findings Past Medical History:   Arthritis                                                    Degenerative disc disease, lumbar                            GERD (gastroesophageal reflux disease)                       Colon polyps                                                 Hyperglycemia                                                Hyperlipidemia                                               Hypertension                                                 Migraine                                                     Vitamin D deficiency                                        Past Surgical History:   COLONOSCOPY  BACK SURGERY                                                 BMI    Body Mass Index   30.25 kg/m 2      Reproductive/Obstetrics negative OB ROS                             Anesthesia  Physical  Anesthesia Plan  ASA: III  Anesthesia Plan: General   Post-op Pain Management:    Induction:   Airway Management Planned:   Additional Equipment:   Intra-op Plan:   Post-operative Plan:   Informed Consent: I have reviewed the patients History and Physical, chart, labs and discussed the procedure including the risks, benefits and alternatives for the proposed anesthesia with the patient or authorized representative who has indicated his/her understanding and acceptance.   Dental Advisory Given  Plan Discussed with: Anesthesiologist, CRNA and Surgeon  Anesthesia Plan Comments:         Anesthesia Quick Evaluation

## 2016-06-21 ENCOUNTER — Encounter: Payer: Self-pay | Admitting: Surgery

## 2018-02-17 ENCOUNTER — Other Ambulatory Visit: Payer: Self-pay | Admitting: Internal Medicine

## 2018-02-17 DIAGNOSIS — R1013 Epigastric pain: Secondary | ICD-10-CM

## 2018-02-21 ENCOUNTER — Ambulatory Visit
Admission: RE | Admit: 2018-02-21 | Discharge: 2018-02-21 | Disposition: A | Discharge status: Home / Self Care | Payer: No Typology Code available for payment source | Source: Ambulatory Visit | Attending: Internal Medicine | Admitting: Internal Medicine

## 2018-02-21 DIAGNOSIS — R634 Abnormal weight loss: Secondary | ICD-10-CM | POA: Diagnosis present

## 2018-02-21 DIAGNOSIS — K760 Fatty (change of) liver, not elsewhere classified: Secondary | ICD-10-CM | POA: Insufficient documentation

## 2018-02-21 DIAGNOSIS — R109 Unspecified abdominal pain: Secondary | ICD-10-CM | POA: Diagnosis present

## 2018-02-21 DIAGNOSIS — R1013 Epigastric pain: Secondary | ICD-10-CM

## 2018-02-25 ENCOUNTER — Other Ambulatory Visit: Payer: Self-pay | Admitting: Internal Medicine

## 2018-02-26 ENCOUNTER — Other Ambulatory Visit: Payer: Self-pay | Admitting: Internal Medicine

## 2018-02-26 DIAGNOSIS — R109 Unspecified abdominal pain: Secondary | ICD-10-CM

## 2018-02-26 DIAGNOSIS — R634 Abnormal weight loss: Secondary | ICD-10-CM

## 2018-03-11 ENCOUNTER — Ambulatory Visit
Admission: RE | Admit: 2018-03-11 | Discharge: 2018-03-11 | Disposition: A | Discharge status: Home / Self Care | Payer: No Typology Code available for payment source | Source: Ambulatory Visit | Attending: Internal Medicine | Admitting: Internal Medicine

## 2018-03-11 DIAGNOSIS — K8689 Other specified diseases of pancreas: Secondary | ICD-10-CM | POA: Diagnosis not present

## 2018-03-11 DIAGNOSIS — R109 Unspecified abdominal pain: Secondary | ICD-10-CM | POA: Diagnosis not present

## 2018-03-11 DIAGNOSIS — I700 Atherosclerosis of aorta: Secondary | ICD-10-CM | POA: Diagnosis not present

## 2018-03-11 DIAGNOSIS — R634 Abnormal weight loss: Secondary | ICD-10-CM | POA: Insufficient documentation

## 2018-03-11 DIAGNOSIS — K869 Disease of pancreas, unspecified: Secondary | ICD-10-CM | POA: Diagnosis not present

## 2018-03-11 MED ORDER — IOHEXOL 300 MG/ML  SOLN
100.0000 mL | Freq: Once | INTRAMUSCULAR | Status: AC | PRN
  Administered 2018-03-11: 100 mL via INTRAVENOUS

## 2018-03-12 ENCOUNTER — Telehealth: Payer: Self-pay

## 2018-03-12 NOTE — Telephone Encounter (Signed)
Received referral from Dr. Alice Reichert for EUS for evaluation and biopsy of pancreatic neoplasm. We have no availability at [facility] until late October. I have spoke with Marco Richards and he is agreeable to have EUS performed at [facility]. [name] is alternate option is insurance is not accepted at [facility]. They will contact him with date/time/instructions. We would like biopsy next week. Oncology Nurse Navigator Documentation  Navigator Location: [facility] (03/12/18 1100)   )Navigator Encounter Type: Telephone (03/12/18 1100) Telephone: [place] Call (03/12/18 1100)                       Barriers/Navigation Needs: Coordination of Care (03/12/18 1100)   Interventions: Coordination of Care (03/12/18 1100)   Coordination of Care: EUS (03/12/18 1100)                  Time Spent with Patient: 15 (03/12/18 1100)

## 2018-03-19 ENCOUNTER — Telehealth: Payer: Self-pay

## 2018-03-19 NOTE — Telephone Encounter (Signed)
Received notification from Dr. 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 Called and notified Herma Carson, Rn at Dr. Ricky Stabs office. Read back performed. They will notify Mr. Takahashi of the results. Oncology Nurse Navigator Documentation  Navigator Location: [facility] (03/19/18 1500)   )Navigator Encounter Type: Telephone (03/19/18 1500) Telephone: Outgoing Call;Diagnostic Results (03/19/18 1500)                               Coordination of Care: EUS (03/19/18 1500)                  Time Spent with Patient: 15 (03/19/18 1500)

## 2018-03-20 NOTE — Telephone Encounter (Signed)
error 

## 2019-06-26 DEATH — deceased

## 2020-04-07 IMAGING — CT CT ABDOMEN W/ CM
2 of 5 series · 15 of 46 positions shown, 17 images · IV contrast (omnipaque)
Comparison: CT the chest, abdomen and pelvis 04/27/2016. Two small
pulmonary nodules in the right lower lobe

CLINICAL DATA: 57-year-old male with history of intermittent
worsening left upper quadrant abdominal pain, worst after eating
certain foods like pizza. Prior history of represent esophagus
status post endoscopic repair.

EXAM:
CT ABDOMEN WITH CONTRAST
TECHNIQUE: Multidetector CT imaging of the abdomen was performed using the
standard protocol following bolus administration of intravenous
contrast.
CONTRAST:  100mL OMNIPAQUE IOHEXOL 300 MG/ML  SOLN

[Series 2: abd pelvis · axial · 0.70mm/px · z∈[-1486,-1241]mm · 12 of 59 slices shown, 14 images (1 of 2)]
[im 5/59  soft-tissue]
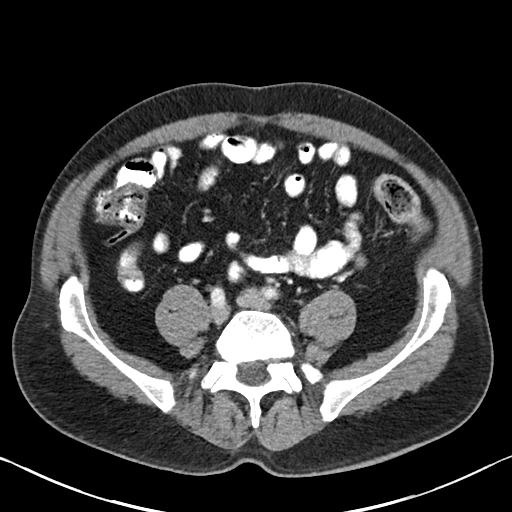
[im 5/59  bone]
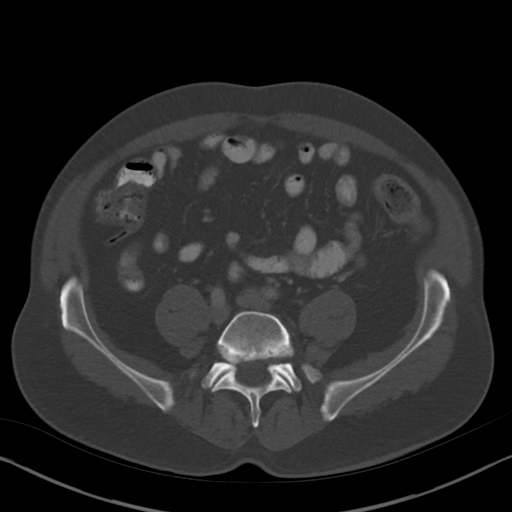
[im 9/59  soft-tissue]
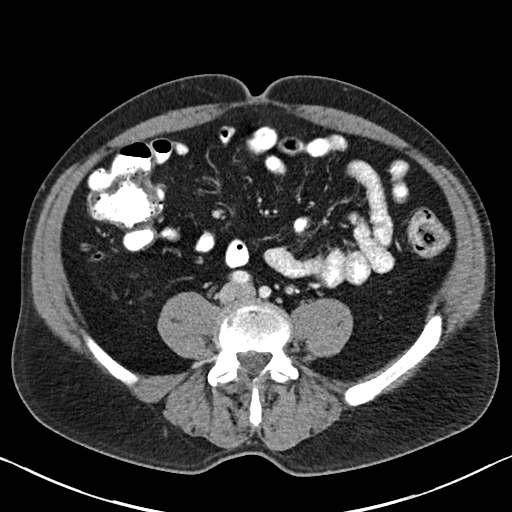
[im 14/59  soft-tissue]
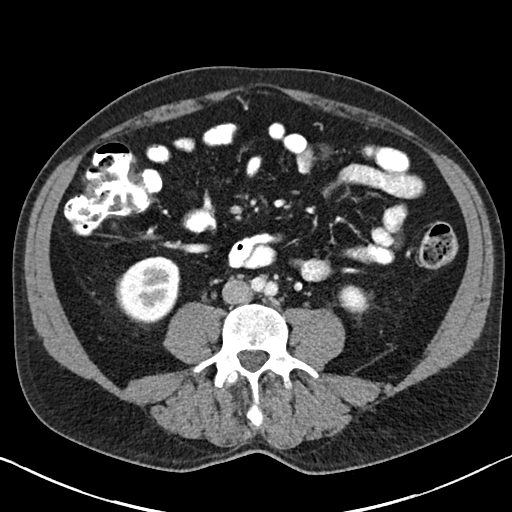
[im 18/59  soft-tissue]
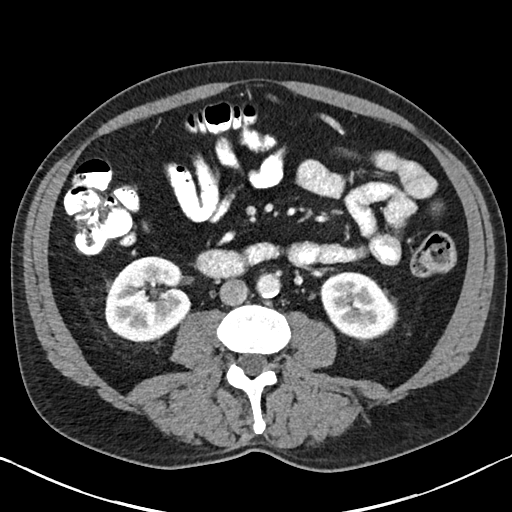
[im 23/59  soft-tissue]
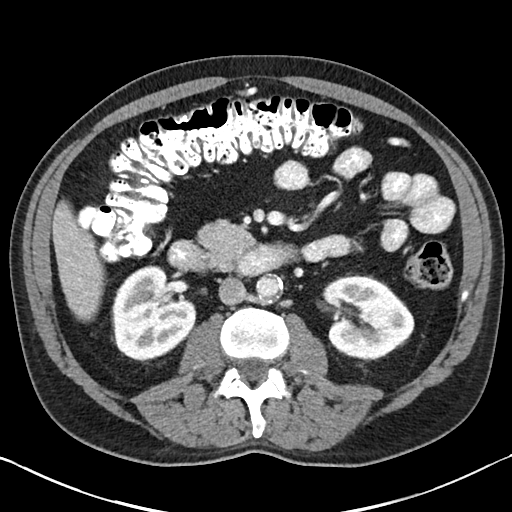
[im 27/59  soft-tissue]
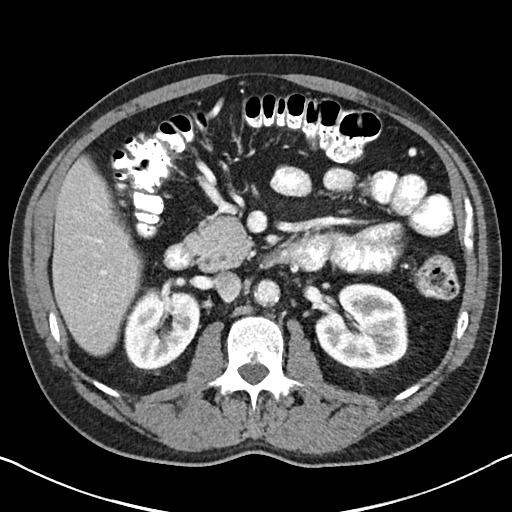
[im 32/59  soft-tissue]
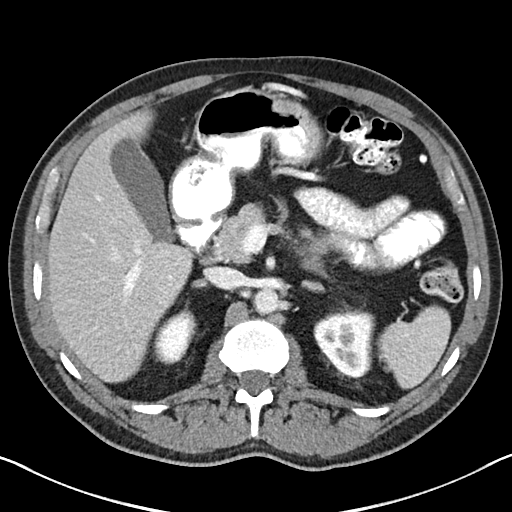
[im 36/59  soft-tissue]
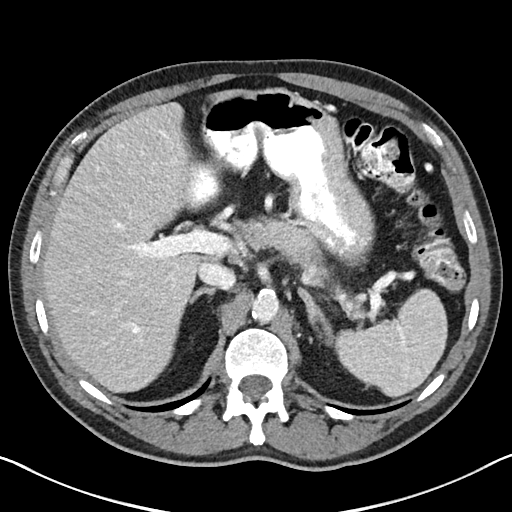
[im 41/59  soft-tissue]
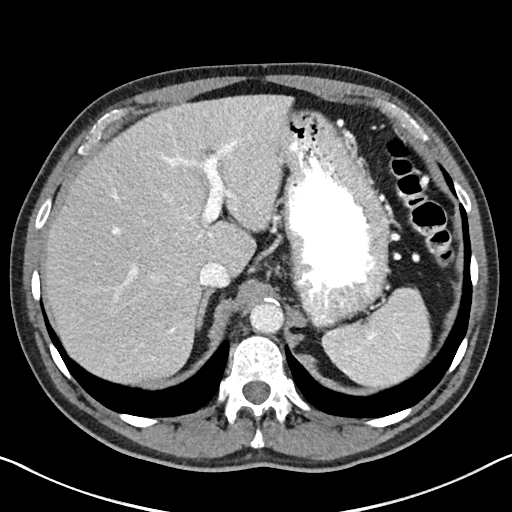
[im 41/59  bone]
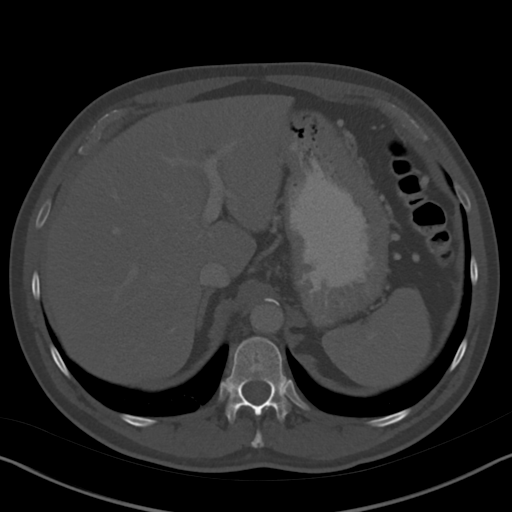
[im 45/59  soft-tissue]
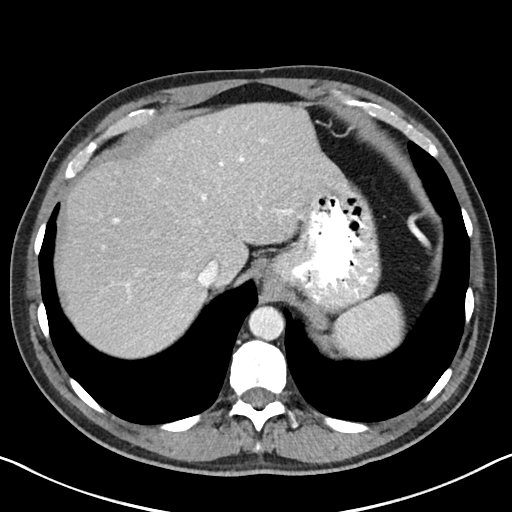
[im 50/59  soft-tissue]
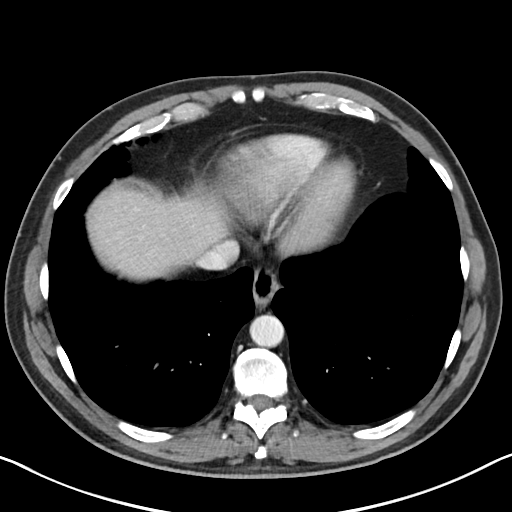
[im 54/59  soft-tissue]
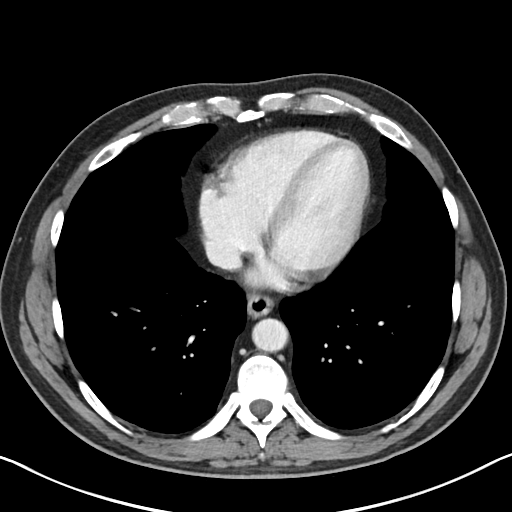

[Series 4: abd pelvis · coronal · 0.57mm/px · 3 of 150 slices shown (2 of 2)]
[im 50/150  soft-tissue]
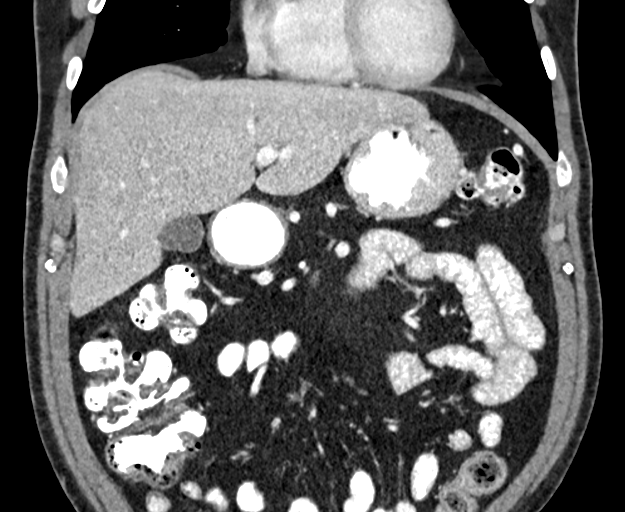
[im 67/150  soft-tissue]
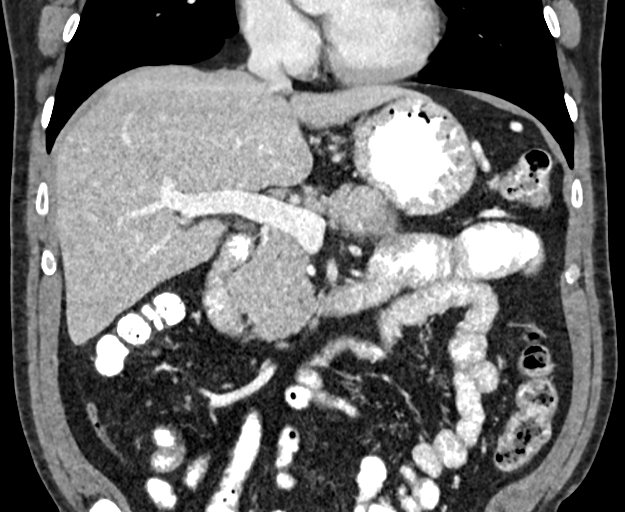
[im 83/150  soft-tissue]
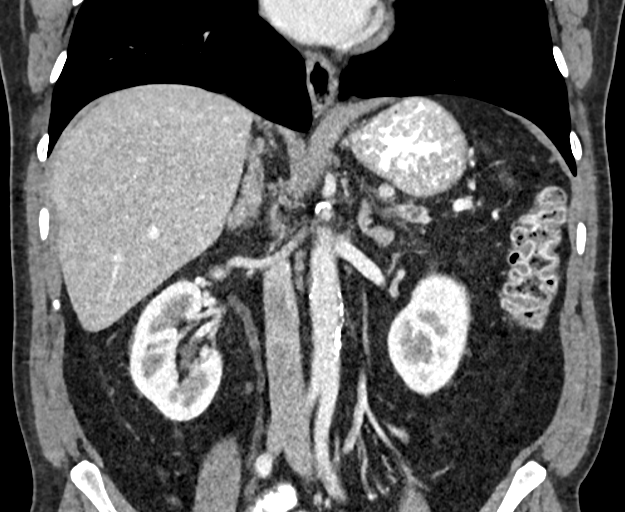

[15 of 46 positions shown; findings below may reference images not displayed]

FINDINGS: Lower chest: Measuring 3 and 4 mm on axial image 11 of series 3,
stable dating back to 04/27/2016, considered definitively benign
requiring no future image followup.

Hepatobiliary: No suspicious cystic or solid hepatic lesions. No
intra or extrahepatic biliary ductal dilatation. Gallbladder is
normal in appearance.

Pancreas: In the distal body of the pancreas there is a poorly
defined 2.7 x 3.5 cm hypovascular area suspicious for a mass (axial
image 26 of series 2). Beyond this, there is severe atrophy in the
tail of the pancreas where there is also pancreatic ductal
dilatation measuring up to 5 mm in diameter. No peripancreatic fluid
collections or inflammatory changes.

Spleen: Unremarkable.

Adrenals/Urinary Tract: Duplication of the right renal collecting
system and proximal third of the right ureter (normal anatomical
variant) incidentally noted. Bilateral kidneys and bilateral adrenal
glands are otherwise normal in appearance. No hydroureteronephrosis
in the visualized portions of the abdomen. Bilateral adrenal glands
are normal in appearance.

Stomach/Bowel: Normal appearance of the stomach. No pathologic
dilatation of visualized portions of small bowel or colon. Normal
appendix.

Vascular/Lymphatic: Aortic atherosclerosis, without evidence of
aneurysm or dissection in the abdominal vasculature. Celiac axis,
superior mesenteric artery and superior mesenteric vein are all well
separated from the suspected pancreatic mass. No lymphadenopathy
noted in the abdomen.

Other: No significant volume of ascites and no pneumoperitoneum
noted in the visualized portions of the peritoneal cavity.

Musculoskeletal: There are no aggressive appearing lytic or blastic
lesions noted in the visualized portions of the skeleton.
IMPRESSION: 1. Ill-defined 2.7 x 3.5 cm hypovascular mass in the distal body of
the pancreas, associated with atrophy throughout the pancreatic tail
and pancreatic ductal dilatation in the pancreatic tail. Findings
are highly concerning for primary pancreatic neoplasm. Further
evaluation with endoscopic ultrasound and biopsy is strongly
recommended in the near future for diagnostic purposes.
2. No definite metastatic disease identified in the abdomen. The
pancreatic mass discussed above does not involve the major abdominal
vasculature.
3. Aortic atherosclerosis.
